# Patient Record
Sex: Female | Born: 1965 | Race: White | Hispanic: No | Marital: Married | Smoking: Never smoker
Health system: Southern US, Community
[De-identification: ages and names within clinical notes are randomized; demographics above are authoritative.]

## PROBLEM LIST (undated history)

## (undated) DIAGNOSIS — Z789 Other specified health status: Secondary | ICD-10-CM

---

## 2005-09-29 ENCOUNTER — Ambulatory Visit: Payer: Self-pay | Admitting: Obstetrics and Gynecology

## 2006-10-12 ENCOUNTER — Ambulatory Visit: Payer: Self-pay | Admitting: Obstetrics and Gynecology

## 2007-10-18 ENCOUNTER — Ambulatory Visit: Payer: Self-pay | Admitting: Obstetrics and Gynecology

## 2008-10-30 ENCOUNTER — Ambulatory Visit: Payer: Self-pay | Admitting: Obstetrics and Gynecology

## 2009-11-16 ENCOUNTER — Ambulatory Visit: Payer: Self-pay | Admitting: Obstetrics and Gynecology

## 2010-12-30 ENCOUNTER — Ambulatory Visit: Payer: Self-pay | Admitting: Obstetrics and Gynecology

## 2012-01-02 ENCOUNTER — Ambulatory Visit: Payer: Self-pay | Admitting: Obstetrics and Gynecology

## 2013-01-02 ENCOUNTER — Ambulatory Visit: Payer: Self-pay | Admitting: Obstetrics and Gynecology

## 2013-05-05 HISTORY — PX: CATARACT EXTRACTION W/ INTRAOCULAR LENS IMPLANT: SHX1309

## 2014-12-09 ENCOUNTER — Other Ambulatory Visit: Payer: Self-pay | Admitting: Obstetrics and Gynecology

## 2014-12-09 DIAGNOSIS — Z1231 Encounter for screening mammogram for malignant neoplasm of breast: Secondary | ICD-10-CM

## 2014-12-18 ENCOUNTER — Ambulatory Visit
Admission: RE | Admit: 2014-12-18 | Discharge: 2014-12-18 | Disposition: A | Discharge status: Home / Self Care | Payer: 59 | Source: Ambulatory Visit | Attending: Obstetrics and Gynecology | Admitting: Obstetrics and Gynecology

## 2014-12-18 DIAGNOSIS — Z1231 Encounter for screening mammogram for malignant neoplasm of breast: Secondary | ICD-10-CM

## 2014-12-22 ENCOUNTER — Other Ambulatory Visit: Payer: Self-pay | Admitting: Obstetrics and Gynecology

## 2014-12-22 DIAGNOSIS — R928 Other abnormal and inconclusive findings on diagnostic imaging of breast: Secondary | ICD-10-CM

## 2014-12-30 ENCOUNTER — Other Ambulatory Visit: Payer: Self-pay | Admitting: Obstetrics and Gynecology

## 2014-12-30 DIAGNOSIS — N6489 Other specified disorders of breast: Secondary | ICD-10-CM

## 2015-02-09 ENCOUNTER — Ambulatory Visit
Admission: RE | Admit: 2015-02-09 | Discharge: 2015-02-09 | Disposition: A | Discharge status: Home / Self Care | Payer: 59 | Source: Ambulatory Visit | Attending: Obstetrics and Gynecology | Admitting: Obstetrics and Gynecology

## 2015-02-09 DIAGNOSIS — R928 Other abnormal and inconclusive findings on diagnostic imaging of breast: Secondary | ICD-10-CM

## 2015-12-15 ENCOUNTER — Other Ambulatory Visit: Payer: Self-pay | Admitting: Obstetrics and Gynecology

## 2015-12-15 DIAGNOSIS — Z1231 Encounter for screening mammogram for malignant neoplasm of breast: Secondary | ICD-10-CM

## 2016-01-12 ENCOUNTER — Ambulatory Visit
Admission: RE | Admit: 2016-01-12 | Discharge: 2016-01-12 | Disposition: A | Discharge status: Home / Self Care | Payer: 59 | Source: Ambulatory Visit | Attending: Obstetrics and Gynecology | Admitting: Obstetrics and Gynecology

## 2016-01-12 ENCOUNTER — Other Ambulatory Visit: Payer: Self-pay | Admitting: Obstetrics and Gynecology

## 2016-01-12 DIAGNOSIS — Z1231 Encounter for screening mammogram for malignant neoplasm of breast: Secondary | ICD-10-CM

## 2016-12-20 ENCOUNTER — Other Ambulatory Visit: Payer: Self-pay | Admitting: Obstetrics and Gynecology

## 2016-12-20 DIAGNOSIS — Z1231 Encounter for screening mammogram for malignant neoplasm of breast: Secondary | ICD-10-CM

## 2017-01-13 ENCOUNTER — Ambulatory Visit
Admission: RE | Admit: 2017-01-13 | Discharge: 2017-01-13 | Disposition: A | Discharge status: Home / Self Care | Payer: 59 | Source: Ambulatory Visit | Attending: Obstetrics and Gynecology | Admitting: Obstetrics and Gynecology

## 2017-01-13 DIAGNOSIS — Z1231 Encounter for screening mammogram for malignant neoplasm of breast: Secondary | ICD-10-CM | POA: Insufficient documentation

## 2017-12-28 ENCOUNTER — Other Ambulatory Visit: Payer: Self-pay | Admitting: Obstetrics and Gynecology

## 2017-12-28 DIAGNOSIS — Z1231 Encounter for screening mammogram for malignant neoplasm of breast: Secondary | ICD-10-CM

## 2018-01-11 ENCOUNTER — Encounter: Payer: Self-pay | Admitting: *Deleted

## 2018-01-11 ENCOUNTER — Other Ambulatory Visit: Payer: Self-pay

## 2018-01-15 NOTE — Discharge Instructions (Signed)

## 2018-01-16 ENCOUNTER — Ambulatory Visit
Admission: RE | Admit: 2018-01-16 | Discharge: 2018-01-16 | Disposition: A | Discharge status: Home / Self Care | Payer: Commercial Managed Care - HMO | Source: Ambulatory Visit | Attending: Obstetrics and Gynecology | Admitting: Obstetrics and Gynecology

## 2018-01-16 DIAGNOSIS — Z1231 Encounter for screening mammogram for malignant neoplasm of breast: Secondary | ICD-10-CM | POA: Diagnosis not present

## 2018-01-17 ENCOUNTER — Ambulatory Visit: Payer: Commercial Managed Care - HMO | Admitting: Anesthesiology

## 2018-01-17 ENCOUNTER — Encounter
Admission: RE | Disposition: A | Discharge status: Home / Self Care | Payer: Self-pay | Source: Ambulatory Visit | Attending: Ophthalmology

## 2018-01-17 ENCOUNTER — Ambulatory Visit
Admission: RE | Admit: 2018-01-17 | Discharge: 2018-01-17 | Disposition: A | Discharge status: Home / Self Care | Payer: Commercial Managed Care - HMO | Source: Ambulatory Visit | Attending: Ophthalmology | Admitting: Ophthalmology

## 2018-01-17 DIAGNOSIS — Z9842 Cataract extraction status, left eye: Secondary | ICD-10-CM | POA: Insufficient documentation

## 2018-01-17 DIAGNOSIS — H25041 Posterior subcapsular polar age-related cataract, right eye: Secondary | ICD-10-CM | POA: Insufficient documentation

## 2018-01-17 HISTORY — DX: Other specified health status: Z78.9

## 2018-01-17 HISTORY — PX: CATARACT EXTRACTION W/PHACO: SHX586

## 2018-01-17 SURGERY — PHACOEMULSIFICATION, CATARACT, WITH IOL INSERTION
Anesthesia: Monitor Anesthesia Care | Site: Eye | Laterality: Right

## 2018-01-17 MED ORDER — NA HYALUR & NA CHOND-NA HYALUR 0.4-0.35 ML IO KIT
PACK | INTRAOCULAR | Status: DC | PRN
  Administered 2018-01-17: 1 mL via INTRAOCULAR

## 2018-01-17 MED ORDER — TETRACAINE HCL 0.5 % OP SOLN
1.0000 [drp] | OPHTHALMIC | Status: DC | PRN
  Administered 2018-01-17 (×2): 1 [drp] via OPHTHALMIC

## 2018-01-17 MED ORDER — FENTANYL CITRATE (PF) 100 MCG/2ML IJ SOLN
INTRAMUSCULAR | Status: DC | PRN
  Administered 2018-01-17 (×2): 50 ug via INTRAVENOUS

## 2018-01-17 MED ORDER — LACTATED RINGERS IV SOLN: 1000.0000 mL | INTRAVENOUS | Status: DC

## 2018-01-17 MED ORDER — EPINEPHRINE PF 1 MG/ML IJ SOLN
INTRAOCULAR | Status: DC | PRN
  Administered 2018-01-17: 58 mL via OPHTHALMIC

## 2018-01-17 MED ORDER — ACETAMINOPHEN 160 MG/5ML PO SOLN: 325.0000 mg | ORAL | Status: DC | PRN

## 2018-01-17 MED ORDER — ARMC OPHTHALMIC DILATING DROPS
1.0000 "application " | OPHTHALMIC | Status: DC | PRN
  Administered 2018-01-17 (×3): 1 via OPHTHALMIC

## 2018-01-17 MED ORDER — CEFUROXIME OPHTHALMIC INJECTION 1 MG/0.1 ML
INJECTION | OPHTHALMIC | Status: DC | PRN
  Administered 2018-01-17: 0.1 mL via INTRACAMERAL

## 2018-01-17 MED ORDER — MIDAZOLAM HCL 2 MG/2ML IJ SOLN
INTRAMUSCULAR | Status: DC | PRN
  Administered 2018-01-17: 1 mg via INTRAVENOUS
  Administered 2018-01-17: 2 mg via INTRAVENOUS

## 2018-01-17 MED ORDER — MOXIFLOXACIN HCL 0.5 % OP SOLN
1.0000 [drp] | OPHTHALMIC | Status: DC | PRN
  Administered 2018-01-17 (×3): 1 [drp] via OPHTHALMIC

## 2018-01-17 MED ORDER — BRIMONIDINE TARTRATE-TIMOLOL 0.2-0.5 % OP SOLN
OPHTHALMIC | Status: DC | PRN
  Administered 2018-01-17: 1 [drp] via OPHTHALMIC

## 2018-01-17 MED ORDER — LIDOCAINE HCL (PF) 2 % IJ SOLN
INTRAOCULAR | Status: DC | PRN
  Administered 2018-01-17: 2 mL

## 2018-01-17 MED ORDER — ONDANSETRON HCL 4 MG/2ML IJ SOLN: 4.0000 mg | Freq: Once | INTRAMUSCULAR | Status: DC | PRN

## 2018-01-17 MED ORDER — ACETAMINOPHEN 325 MG PO TABS: 650.0000 mg | ORAL_TABLET | Freq: Once | ORAL | Status: DC | PRN

## 2018-01-17 SURGICAL SUPPLY — 20 items
CANNULA ANT/CHMB 27G (MISCELLANEOUS) ×1 IMPLANT
CANNULA ANT/CHMB 27GA (MISCELLANEOUS) ×3 IMPLANT
GLOVE SURG LX 7.5 STRW (GLOVE) ×2
GLOVE SURG LX STRL 7.5 STRW (GLOVE) ×1 IMPLANT
GLOVE SURG TRIUMPH 8.0 PF LTX (GLOVE) ×3 IMPLANT
GOWN STRL REUS W/ TWL LRG LVL3 (GOWN DISPOSABLE) ×2 IMPLANT
GOWN STRL REUS W/TWL LRG LVL3 (GOWN DISPOSABLE) ×4
LENS IOL ACRYSOF IQ 22.5 (Intraocular Lens) ×2 IMPLANT
MARKER SKIN DUAL TIP RULER LAB (MISCELLANEOUS) ×3 IMPLANT
NDL FILTER BLUNT 18X1 1/2 (NEEDLE) ×1 IMPLANT
NEEDLE FILTER BLUNT 18X 1/2SAF (NEEDLE) ×2
NEEDLE FILTER BLUNT 18X1 1/2 (NEEDLE) ×1 IMPLANT
PACK CATARACT BRASINGTON (MISCELLANEOUS) ×3 IMPLANT
PACK EYE AFTER SURG (MISCELLANEOUS) ×3 IMPLANT
PACK OPTHALMIC (MISCELLANEOUS) ×3 IMPLANT
SYR 3ML LL SCALE MARK (SYRINGE) ×3 IMPLANT
SYR 5ML LL (SYRINGE) ×3 IMPLANT
SYR TB 1ML LUER SLIP (SYRINGE) ×3 IMPLANT
WATER STERILE IRR 500ML POUR (IV SOLUTION) ×3 IMPLANT
WIPE NON LINTING 3.25X3.25 (MISCELLANEOUS) ×3 IMPLANT

## 2018-01-17 NOTE — Anesthesia Preprocedure Evaluation (Signed)
Anesthesia Evaluation  Patient identified by MRN, date of birth, ID band Patient awake    Reviewed: Allergy & Precautions, NPO status , Patient's Chart, lab work & pertinent test results  History of Anesthesia Complications Negative for: history of anesthetic complications  Airway Mallampati: II  TM Distance: >3 FB Neck ROM: Full    Dental no notable dental hx.  Crowns :   Pulmonary neg pulmonary ROS,    Pulmonary exam normal breath sounds clear to auscultation       Cardiovascular Exercise Tolerance: Good negative cardio ROS Normal cardiovascular exam Rhythm:Regular Rate:Normal     Neuro/Psych negative neurological ROS     GI/Hepatic negative GI ROS,   Endo/Other  negative endocrine ROS  Renal/GU negative Renal ROS     Musculoskeletal   Abdominal   Peds  Hematology negative hematology ROS (+)   Anesthesia Other Findings   Reproductive/Obstetrics                             Anesthesia Physical Anesthesia Plan  ASA: I  Anesthesia Plan: MAC   Post-op Pain Management:    Induction: Intravenous  PONV Risk Score and Plan: 2 and TIVA and Midazolam  Airway Management Planned: Natural Airway  Additional Equipment:   Intra-op Plan:   Post-operative Plan:   Informed Consent: I have reviewed the patients History and Physical, chart, labs and discussed the procedure including the risks, benefits and alternatives for the proposed anesthesia with the patient or authorized representative who has indicated his/her understanding and acceptance.     Plan Discussed with: CRNA  Anesthesia Plan Comments:         Anesthesia Quick Evaluation

## 2018-01-17 NOTE — Transfer of Care (Signed)
Immediate Anesthesia Transfer of Care Note  Patient: Taylor Carter  Procedure(s) Performed: CATARACT EXTRACTION PHACO AND INTRAOCULAR LENS PLACEMENT (IOC)RIGHT (Right Eye)  Patient Location: PACU  Anesthesia Type: MAC  Level of Consciousness: awake, alert  and patient cooperative  Airway and Oxygen Therapy: Patient Spontanous Breathing and Patient connected to supplemental oxygen  Post-op Assessment: Post-op Vital signs reviewed, Patient's Cardiovascular Status Stable, Respiratory Function Stable, Patent Airway and No signs of Nausea or vomiting  Post-op Vital Signs: Reviewed and stable  Complications: No apparent anesthesia complications

## 2018-01-17 NOTE — Anesthesia Procedure Notes (Signed)
Procedure Name: MAC Date/Time: 01/17/2018 8:30 AM Performed by: Janna Arch, CRNA Pre-anesthesia Checklist: Patient identified, Emergency Drugs available, Suction available, Timeout performed and Patient being monitored Patient Re-evaluated:Patient Re-evaluated prior to induction Oxygen Delivery Method: Nasal cannula Placement Confirmation: positive ETCO2

## 2018-01-17 NOTE — Anesthesia Postprocedure Evaluation (Signed)
Anesthesia Post Note  Patient: Taylor Carter  Procedure(s) Performed: CATARACT EXTRACTION PHACO AND INTRAOCULAR LENS PLACEMENT (IOC)RIGHT (Right Eye)  Patient location during evaluation: PACU Anesthesia Type: MAC Level of consciousness: awake and alert, oriented and patient cooperative Pain management: pain level controlled Vital Signs Assessment: post-procedure vital signs reviewed and stable Respiratory status: spontaneous breathing, nonlabored ventilation and respiratory function stable Cardiovascular status: blood pressure returned to baseline and stable Postop Assessment: adequate PO intake Anesthetic complications: no    Darrin Nipper

## 2018-01-17 NOTE — H&P (Signed)
The History and Physical notes are on paper, have been signed, and are to be scanned. The patient remains stable and unchanged from the H&P.   Previous H&P reviewed, patient examined, and there are no changes.  Taylor Carter 01/17/2018 8:07 AM

## 2018-01-17 NOTE — Op Note (Signed)
LOCATION:  [facility]   PREOPERATIVE DIAGNOSIS:    Posterior subcapsular cataract right eye. H25.041   POSTOPERATIVE DIAGNOSIS:   Posterior subcapsular cataract right eye. H25.041    PROCEDURE:  Phacoemusification with posterior chamber intraocular lens placement of the right eye   LENS:   Implant Name Type Inv. Item Serial No. Manufacturer Lot No. LRB No. Used  LENS IOL ACRYSOF IQ 22.5 - [id number] Intraocular Lens LENS IOL ACRYSOF IQ 22.5 [id number] ALCON  Right 1        ULTRASOUND TIME: 9 % of 0 minutes, 30 seconds.  CDE 2.8   SURGEON:  Deirdre Evenerhadwick R. Abbye Lao, MD   ANESTHESIA:  Topical with tetracaine drops and 2% Xylocaine jelly, augmented with 1% preservative-free intracameral lidocaine.    COMPLICATIONS:  None.   DESCRIPTION OF PROCEDURE:  The patient was identified in the holding room and transported to the operating room and placed in the supine position under the operating microscope.  The right eye was identified as the operative eye and it was prepped and draped in the usual sterile ophthalmic fashion.   A 1 millimeter clear-corneal paracentesis was made at the 12:00 position.  0.5 ml of preservative-free 1% lidocaine was injected into the anterior chamber. The anterior chamber was filled with Viscoat viscoelastic.  A 2.4 millimeter keratome was used to make a near-clear corneal incision at the 9:00 position.  A curvilinear capsulorrhexis was made with a cystotome and capsulorrhexis forceps.  Balanced salt solution was used to hydrodissect and hydrodelineate the nucleus.   Phacoemulsification was then used in stop and chop fashion to remove the lens nucleus and epinucleus.  The remaining cortex was then removed using the irrigation and aspiration handpiece. Provisc was then placed into the capsular bag to distend it for lens placement.  A lens was then injected into the capsular bag.  The remaining viscoelastic was aspirated.   Wounds were hydrated with balanced  salt solution.  The anterior chamber was inflated to a physiologic pressure with balanced salt solution.  No wound leaks were noted. Cefuroxime 0.1 ml of a 10mg /ml solution was injected into the anterior chamber for a dose of 1 mg of intracameral antibiotic at the completion of the case.   Timolol and Brimonidine drops were applied to the eye.  The patient was taken to the recovery room in stable condition without complications of anesthesia or surgery.   Neiko Trivedi 01/17/2018, 8:50 AM

## 2018-01-18 ENCOUNTER — Encounter: Payer: Self-pay | Admitting: Ophthalmology

## 2019-01-15 ENCOUNTER — Other Ambulatory Visit: Payer: Self-pay | Admitting: Obstetrics and Gynecology

## 2019-01-15 DIAGNOSIS — Z1231 Encounter for screening mammogram for malignant neoplasm of breast: Secondary | ICD-10-CM

## 2019-01-23 ENCOUNTER — Ambulatory Visit
Admission: RE | Admit: 2019-01-23 | Discharge: 2019-01-23 | Disposition: A | Discharge status: Home / Self Care | Payer: BC Managed Care – PPO | Source: Ambulatory Visit | Attending: Obstetrics and Gynecology | Admitting: Obstetrics and Gynecology

## 2019-01-23 DIAGNOSIS — Z1231 Encounter for screening mammogram for malignant neoplasm of breast: Secondary | ICD-10-CM | POA: Diagnosis present

## 2021-03-11 IMAGING — MG DIGITAL SCREENING BILAT W/ TOMO W/ CAD
8 series · 8 of 24 positions shown · non-contrast
Comparison: Previous exam(s).

CLINICAL DATA: Screening.

EXAM:
DIGITAL SCREENING BILATERAL MAMMOGRAM WITH TOMO AND CAD

[R CC synth-2D]
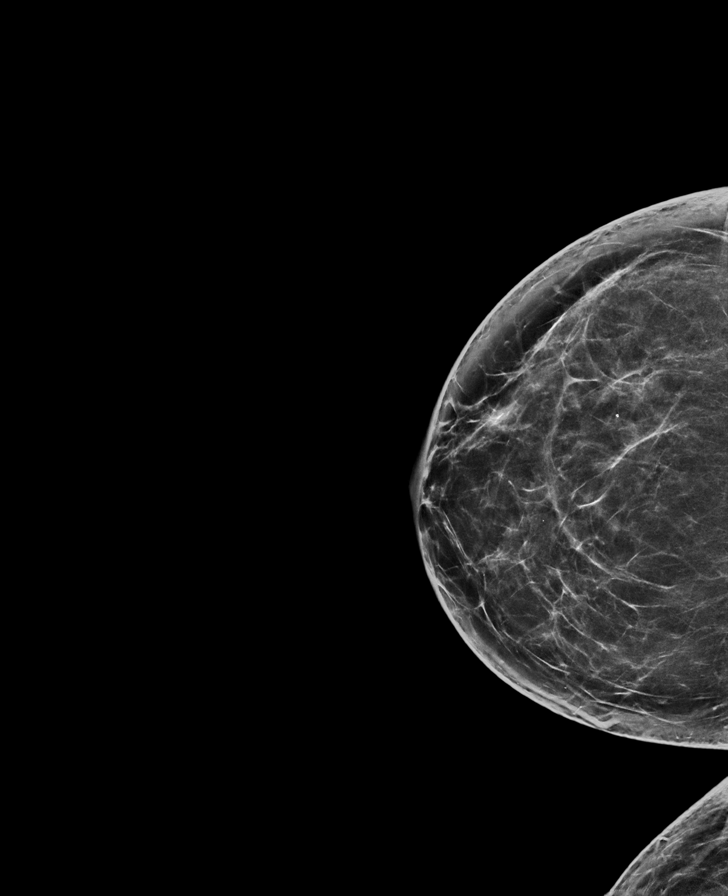

[R MLO synth-2D]
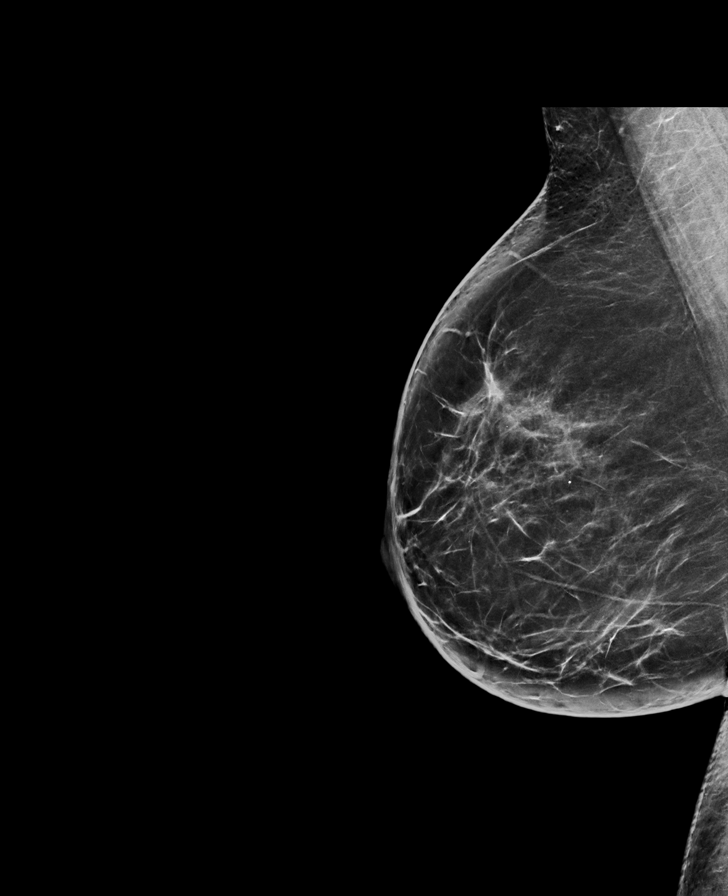

[L CC synth-2D]
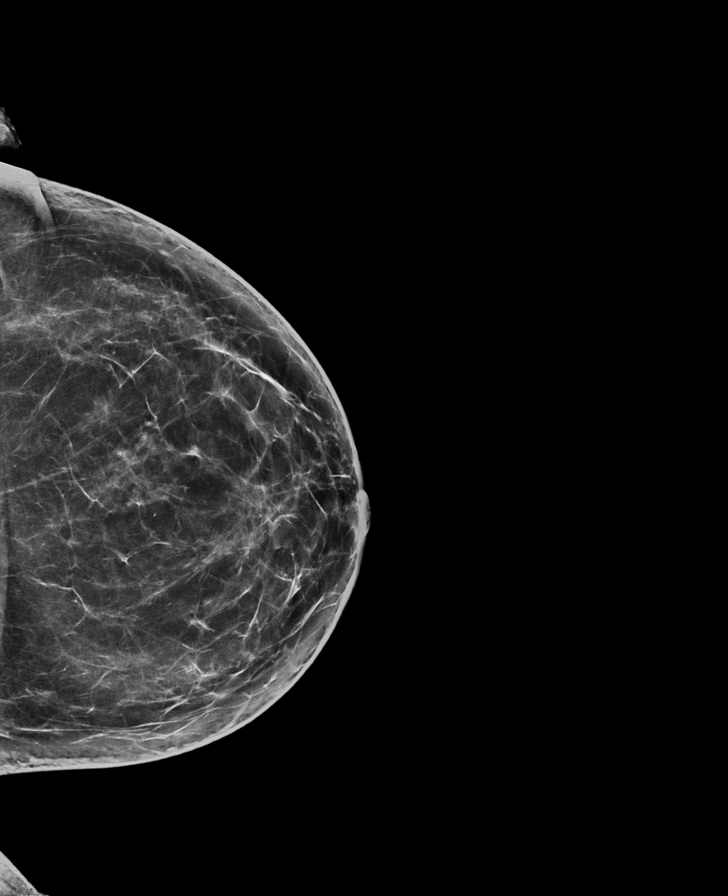

[L MLO synth-2D]
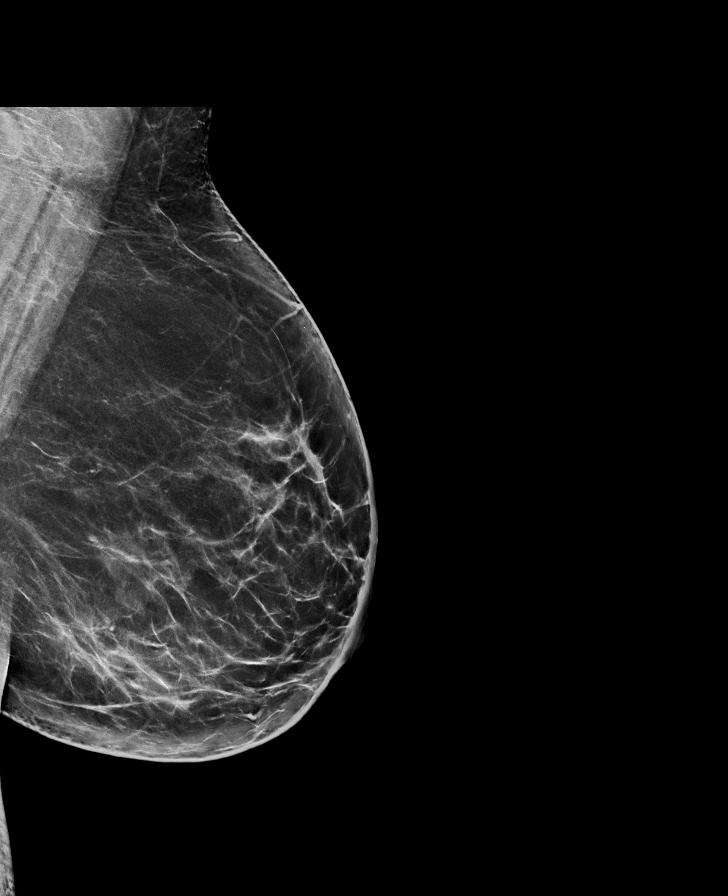

[R CC tomo · tomo slice 40/79.0]
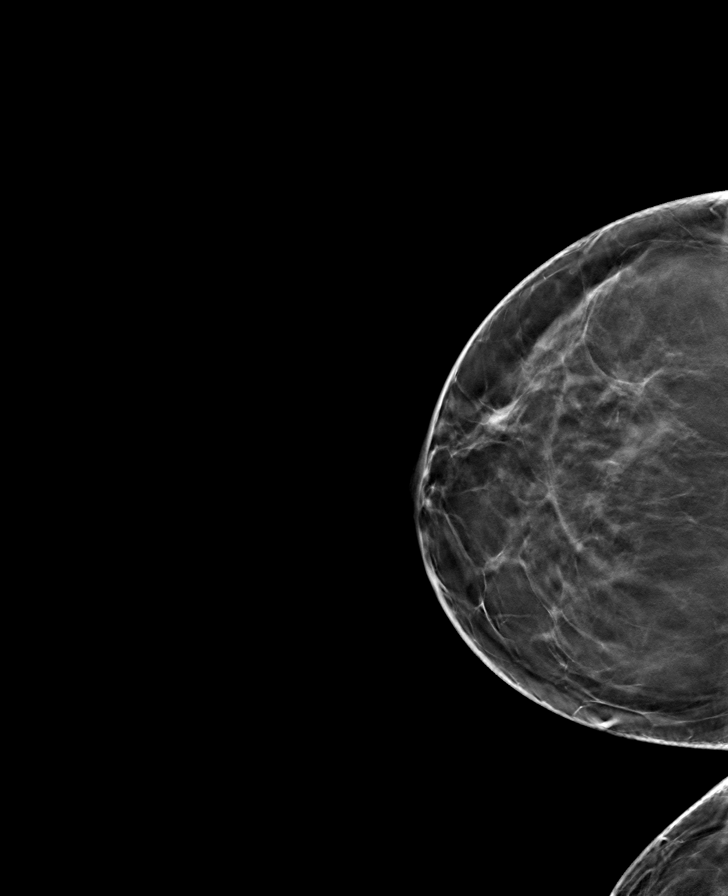

[L CC tomo · tomo slice 42/83.0]
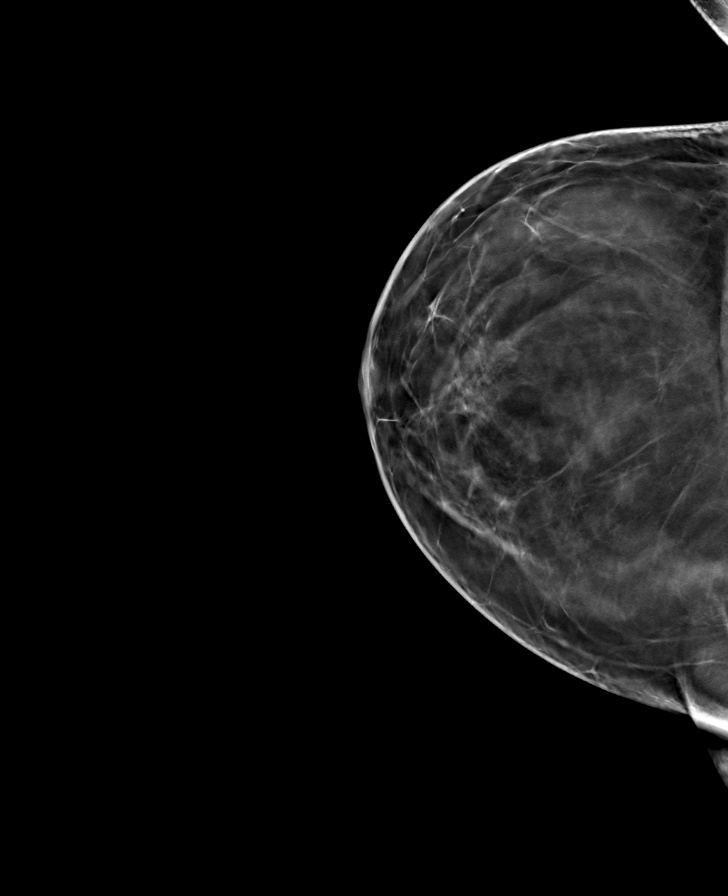

[R MLO tomo · tomo slice 43/85.0]
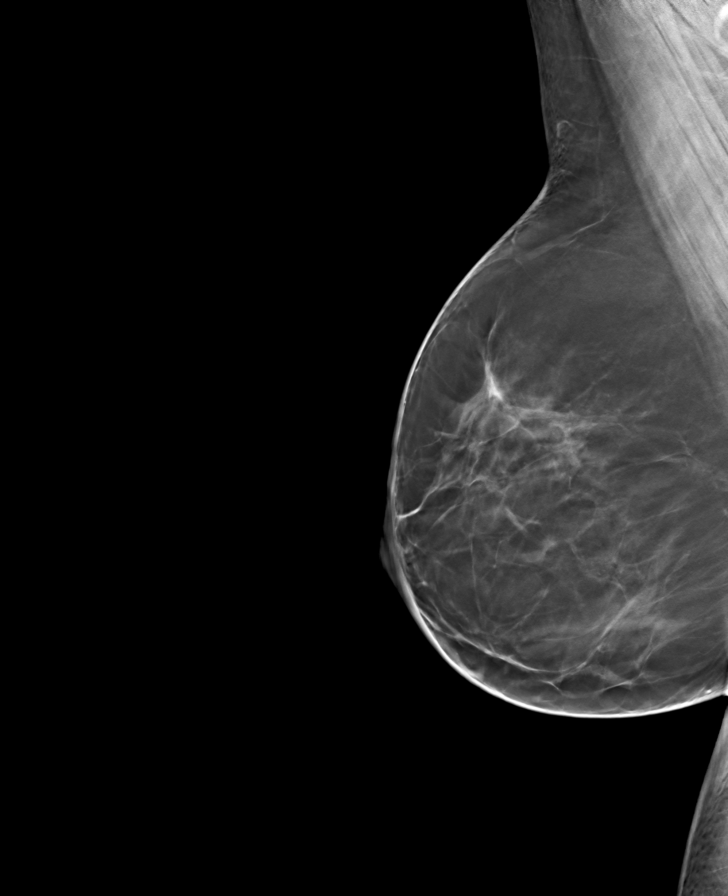

[L MLO tomo · tomo slice 45/88.0]
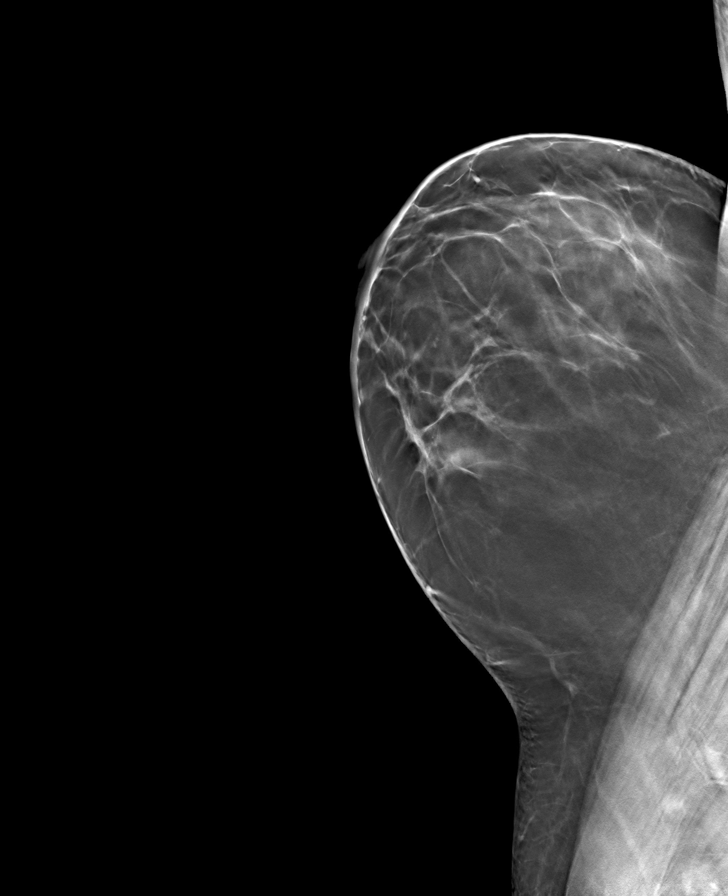

[8 of 24 positions shown; findings below may reference images not displayed]

ACR Breast Density Category b: There are scattered areas of
fibroglandular density.
FINDINGS: There are no findings suspicious for malignancy. Images were
processed with CAD.
IMPRESSION: No mammographic evidence of malignancy. A result letter of this
screening mammogram will be mailed directly to the patient.

RECOMMENDATION:
Screening mammogram in one year. (Code:[id number])

BI-RADS CATEGORY  1: Negative.
